# Patient Record
Sex: Female | Born: 1945 | Race: White | Hispanic: No | Smoking: Former smoker
Health system: Southern US, Community
[De-identification: ages and names within clinical notes are randomized; demographics above are authoritative.]

## PROBLEM LIST (undated history)

## (undated) DIAGNOSIS — M1990 Unspecified osteoarthritis, unspecified site: Secondary | ICD-10-CM

## (undated) DIAGNOSIS — F419 Anxiety disorder, unspecified: Secondary | ICD-10-CM

## (undated) DIAGNOSIS — F32A Depression, unspecified: Secondary | ICD-10-CM

## (undated) DIAGNOSIS — H269 Unspecified cataract: Secondary | ICD-10-CM

## (undated) DIAGNOSIS — G4700 Insomnia, unspecified: Secondary | ICD-10-CM

## (undated) DIAGNOSIS — T7840XA Allergy, unspecified, initial encounter: Secondary | ICD-10-CM

## (undated) DIAGNOSIS — F329 Major depressive disorder, single episode, unspecified: Secondary | ICD-10-CM

## (undated) DIAGNOSIS — K219 Gastro-esophageal reflux disease without esophagitis: Secondary | ICD-10-CM

## (undated) DIAGNOSIS — E079 Disorder of thyroid, unspecified: Secondary | ICD-10-CM

## (undated) HISTORY — PX: VEIN SURGERY: SHX48

## (undated) HISTORY — DX: Gastro-esophageal reflux disease without esophagitis: K21.9

## (undated) HISTORY — DX: Allergy, unspecified, initial encounter: T78.40XA

## (undated) HISTORY — DX: Unspecified cataract: H26.9

## (undated) HISTORY — PX: COLONOSCOPY: SHX174

## (undated) HISTORY — DX: Anxiety disorder, unspecified: F41.9

## (undated) HISTORY — DX: Major depressive disorder, single episode, unspecified: F32.9

## (undated) HISTORY — PX: POLYPECTOMY: SHX149

## (undated) HISTORY — DX: Unspecified osteoarthritis, unspecified site: M19.90

## (undated) HISTORY — PX: ABDOMINAL HYSTERECTOMY: SHX81

## (undated) HISTORY — DX: Insomnia, unspecified: G47.00

## (undated) HISTORY — PX: BREAST ENHANCEMENT SURGERY: SHX7

## (undated) HISTORY — DX: Depression, unspecified: F32.A

## (undated) HISTORY — DX: Disorder of thyroid, unspecified: E07.9

---

## 2012-12-23 ENCOUNTER — Encounter: Payer: Self-pay | Admitting: Internal Medicine

## 2013-01-02 ENCOUNTER — Encounter: Payer: Self-pay | Admitting: Internal Medicine

## 2013-01-07 ENCOUNTER — Encounter: Payer: Self-pay | Admitting: Internal Medicine

## 2013-01-07 ENCOUNTER — Ambulatory Visit (INDEPENDENT_AMBULATORY_CARE_PROVIDER_SITE_OTHER): Payer: Medicare Other | Admitting: Internal Medicine

## 2013-01-07 ENCOUNTER — Ambulatory Visit: Payer: Self-pay | Admitting: Internal Medicine

## 2013-01-07 ENCOUNTER — Other Ambulatory Visit (INDEPENDENT_AMBULATORY_CARE_PROVIDER_SITE_OTHER): Payer: Medicare Other

## 2013-01-07 VITALS — BP 142/72 | HR 76 | Ht 68.0 in | Wt 183.5 lb

## 2013-01-07 DIAGNOSIS — R110 Nausea: Secondary | ICD-10-CM

## 2013-01-07 DIAGNOSIS — R1013 Epigastric pain: Secondary | ICD-10-CM

## 2013-01-07 DIAGNOSIS — K219 Gastro-esophageal reflux disease without esophagitis: Secondary | ICD-10-CM | POA: Insufficient documentation

## 2013-01-07 DIAGNOSIS — Z1211 Encounter for screening for malignant neoplasm of colon: Secondary | ICD-10-CM

## 2013-01-07 DIAGNOSIS — R1310 Dysphagia, unspecified: Secondary | ICD-10-CM

## 2013-01-07 LAB — TSH: TSH: 1.39 u[IU]/mL (ref 0.35–5.50)

## 2013-01-07 LAB — CBC WITH DIFFERENTIAL/PLATELET
Basophils Absolute: 0 10*3/uL (ref 0.0–0.1)
HCT: 37.4 % (ref 36.0–46.0)
Lymphs Abs: 2.6 10*3/uL (ref 0.7–4.0)
MCHC: 34.2 g/dL (ref 30.0–36.0)
MCV: 89.9 fl (ref 78.0–100.0)
Monocytes Absolute: 1 10*3/uL (ref 0.1–1.0)
Platelets: 205 10*3/uL (ref 150.0–400.0)
RDW: 13 % (ref 11.5–14.6)

## 2013-01-07 LAB — COMPREHENSIVE METABOLIC PANEL
AST: 16 U/L (ref 0–37)
Alkaline Phosphatase: 49 U/L (ref 39–117)
BUN: 13 mg/dL (ref 6–23)
Calcium: 10.8 mg/dL — ABNORMAL HIGH (ref 8.4–10.5)
Creatinine, Ser: 0.7 mg/dL (ref 0.4–1.2)
Glucose, Bld: 103 mg/dL — ABNORMAL HIGH (ref 70–99)

## 2013-01-07 LAB — IGA: IgA: 248 mg/dL (ref 68–378)

## 2013-01-07 MED ORDER — ALIGN PO CAPS: 1.0000 | ORAL_CAPSULE | Freq: Every day | ORAL | Status: DC

## 2013-01-07 MED ORDER — MOVIPREP 100 G PO SOLR: 1.0000 | Freq: Once | ORAL | Status: DC

## 2013-01-07 NOTE — Patient Instructions (Signed)
Please go to the basement lab today for blood work You have been scheduled for a CT scan 01/10/13 1:00.  Please see additional instruction sheet for details and instructions You have been scheduled for a endoscopy and colonoscopy please see additional instruction sheets for details and instructions Please start on Align - 1 capsule daily Please pick up your prescription of Movi Prep at your pharmacy

## 2013-01-07 NOTE — Progress Notes (Signed)
Patient ID: Joanne Weber, female   DOB: 1946-06-02, 67 y.o.   MRN: [id number]  SUBJECTIVE: HPI Joanne Weber is a 67 yo female with PMH of arthritis and depression who is seen for evaluation of upper abdominal pain and nausea along with esophageal dysphagia. The patient reports over the last several weeks to months she's developed aching upper abdominal pain located in the epigastrium and at times left upper quadrant. She also endorses nausea without vomiting. The nausea is specifically postprandial. She continues to report a good appetite and no early satiety. She does have trouble with esophageal dysphagia, particularly for solid foods. Occasionally a large bolus of liquid will also reproduce her dysphagia symptoms. She reports frequent heartburn and using over-the-counter Gaviscon on a daily basis. Occasionally she uses this multiple times daily. She reports her bowel movements have changed over the last several years, previously being once per week over her lifetime, but now she is having smaller stools several times a day. She notes bowel movements usually with every urination, though they are small. She endorses frequent upper and lower GI gas. She denies melena or rectal bleeding. She does report a family history of GI cancer including pancreas cancer in her mother, a tumor at the ampulla in her father, and liver cancer in her brother, though there is some mention that this may be bone cancer primarily metastasizing to liver. She does not think her brother has cirrhosis. No fevers or chills. No weight loss.  She did have a colonoscopy performed 12 years ago for screening which was reportedly normal. No prior upper endoscopy  Review of Systems  As per history of present illness, otherwise negative   Past Medical History  Diagnosis Date  . Arthritis   . Depression   . Insomnia     Current Outpatient Prescriptions  Medication Sig Dispense Refill  . estrogens, conjugated, (PREMARIN) 0.625 MG tablet  Take 0.625 mg by mouth daily. Take daily for 21 days then do not take for 7 days.      . hydrochlorothiazide (HYDRODIURIL) 25 MG tablet Take 25 mg by mouth daily.      Marland Kitchen loratadine (ALLERGY RELIEF) 10 MG tablet Take 10 mg by mouth as needed for allergies.      . bifidobacterium infantis (ALIGN) capsule Take 1 capsule by mouth daily.  14 capsule  0  . MOVIPREP 100 G SOLR Take 1 kit (100 g total) by mouth once.  1 kit  0   No current facility-administered medications for this visit.    Allergies  Allergen Reactions  . Latex Itching, Swelling and Rash  . Penicillins Itching, Swelling and Rash    Family History  Problem Relation Age of Onset  . Liver cancer Brother     and bone cancer  . Pancreatic cancer Mother   . Prostate cancer Father   . Colon cancer Maternal Aunt   . Diabetes Father   . Diabetes Brother   . Heart disease Father   . Other Father     ampulla fo vater tumor    History  Substance Use Topics  . Smoking status: Former Smoker -- 2 years    Types: Cigarettes    Quit date: 11/07/1967  . Smokeless tobacco: Never Used  . Alcohol Use: Yes     Comment: occas    OBJECTIVE: BP 142/72  Pulse 76  Ht 5\' 8"  (1.727 m)  Wt 183 lb 8 oz (83.235 kg)  BMI 27.91 kg/m2 Constitutional: Well-developed and well-nourished. No distress.  HEENT: Normocephalic and atraumatic. Oropharynx is clear and moist. No oropharyngeal exudate. Conjunctivae are normal. No scleral icterus. Neck: Neck supple. Trachea midline. Cardiovascular: Normal rate, regular rhythm and intact distal pulses. No M/R/G Pulmonary/chest: Effort normal and breath sounds normal. No wheezing, rales or rhonchi. Abdominal: Soft, mild epigastric tenderness without rebound or guarding, nondistended. Bowel sounds active throughout. There are no masses palpable. No hepatosplenomegaly. Extremities: no clubbing, cyanosis, or edema Lymphadenopathy: No cervical adenopathy noted. Neurological: Alert and oriented to person  place and time. Skin: Skin is warm and dry. No rashes noted. Psychiatric: Normal mood and affect. Behavior is normal.  Labs  CMP, CBC, celiac panel and TSH today  ASSESSMENT AND PLAN: 67 yo female with PMH of arthritis and depression who is seen for evaluation of upper abdominal pain and nausea along with esophageal dysphagia.  1.  Epigastric pain/nausea/esophageal dysphagia -- her symptoms seem dyspeptic in nature and she certainly has symptoms consistent with GERD. She is using Gaviscon on a daily basis and I have offered her PPI therapy, but she wishes to pursue endoscopy first before starting any new medications. Her thought is that she does not want to be on a medication she might not need. We discussed upper endoscopy, which is my recommendation today to better evaluate her epigastric pain and dysphagia. We discussed the test and she is agreeable to proceed. Also will perform a CT scan of the abdomen and pelvis to further evaluate her symptoms and also rule out any pancreatic lesion/tumor which is greatly concerned about given her family history and current symptoms.  2.  Colorectal cancer screening -- she is due for screening colonoscopy and we will schedule this on the same day as her upper endoscopy. The risks and benefits were discussed and she is agreeable to proceed. I would like her date try Align one capsule daily as a probiotic to help with her gas and bloating and to hopefully provide more bowel regularity. We discussed that her small stools associated with urination may be secondary to pelvic floor dyssynergia but we will proceed with colonoscopy first before any further workup/evaluation.

## 2013-01-10 ENCOUNTER — Other Ambulatory Visit: Payer: Medicare Other

## 2013-01-24 ENCOUNTER — Ambulatory Visit (INDEPENDENT_AMBULATORY_CARE_PROVIDER_SITE_OTHER)
Admission: RE | Admit: 2013-01-24 | Discharge: 2013-01-24 | Disposition: A | Discharge status: Home / Self Care | Payer: Medicare Other | Source: Ambulatory Visit | Attending: Internal Medicine | Admitting: Internal Medicine

## 2013-01-24 DIAGNOSIS — R110 Nausea: Secondary | ICD-10-CM

## 2013-01-24 DIAGNOSIS — K219 Gastro-esophageal reflux disease without esophagitis: Secondary | ICD-10-CM

## 2013-01-24 DIAGNOSIS — R1013 Epigastric pain: Secondary | ICD-10-CM

## 2013-01-24 MED ORDER — IOHEXOL 300 MG/ML  SOLN
100.0000 mL | Freq: Once | INTRAMUSCULAR | Status: AC | PRN
  Administered 2013-01-24: 100 mL via INTRAVENOUS

## 2013-01-28 ENCOUNTER — Telehealth: Payer: Self-pay | Admitting: *Deleted

## 2013-01-28 NOTE — Telephone Encounter (Signed)
Message copied by Florene Glen on Tue Jan 28, 2013  1:13 PM ------      Message from: Beverley Fiedler      Created: Tue Jan 28, 2013 12:34 PM       Please let patient know her CT scan of the abdomen shows the following:       Overall her bowel, gallbladder, pancreas, and spleen looked normal      She has 2 small lesions in her liver one is a hemangioma, which is a benign collection of blood vessels. The other looks like a benign cyst -- I do not expect this is causing any symptoms.      She also has 2 cysts in her kidneys which look benign      She has a lesion in her left adrenal gland, which is likely an adenoma, which are usually benign.  These can produce hormones, and while this is unlikely, it should be evaluated further either by her primary care provider or by an endocrinologist. Please offer an endocrinology referral or that she contract her PCP. If she elects to do so with her PCP, please fax a copy of this report to her PCP      We will proceed with endoscopy and colonoscopy in April as scheduled      All in all this is a good report, but often CT scan shows other "incidental" findings as described above.       ------

## 2013-01-28 NOTE — Telephone Encounter (Signed)
Informed pt of her CT results and the need for an endocrinology appt. I gave her several names and she will ask a nurse friend for advice and call me when she arrives home; pt is in [place] for her brother's funeral. She asked that I fax the CT results to her PCP, Dr Caryl Asp in [place]; done. Fax [phone]

## 2013-01-31 ENCOUNTER — Telehealth: Payer: Self-pay | Admitting: *Deleted

## 2013-01-31 NOTE — Telephone Encounter (Signed)
I had called pt on 01/28/13 and she was to call me back with the names of an Endocrinologist. She called to day and asked to be referred to Dr Particia Nearing in [place]; she has an appt on 02/07/13. Called Dr Beckie Busing ofc and received the fax number., [phone] and faxed info. Pt requested copies, so I mailed them to her. ofc # [id number].

## 2013-02-24 ENCOUNTER — Encounter: Payer: Self-pay | Admitting: *Deleted

## 2013-02-25 ENCOUNTER — Encounter: Payer: Self-pay | Admitting: Internal Medicine

## 2013-02-25 ENCOUNTER — Ambulatory Visit (AMBULATORY_SURGERY_CENTER): Payer: Medicare Other | Admitting: Internal Medicine

## 2013-02-25 VITALS — BP 153/76 | HR 65 | Temp 96.9°F | Resp 23 | Ht 68.0 in | Wt 183.0 lb

## 2013-02-25 DIAGNOSIS — K635 Polyp of colon: Secondary | ICD-10-CM

## 2013-02-25 DIAGNOSIS — D126 Benign neoplasm of colon, unspecified: Secondary | ICD-10-CM

## 2013-02-25 DIAGNOSIS — R1013 Epigastric pain: Secondary | ICD-10-CM

## 2013-02-25 DIAGNOSIS — R1319 Other dysphagia: Secondary | ICD-10-CM

## 2013-02-25 DIAGNOSIS — R1310 Dysphagia, unspecified: Secondary | ICD-10-CM

## 2013-02-25 DIAGNOSIS — K219 Gastro-esophageal reflux disease without esophagitis: Secondary | ICD-10-CM

## 2013-02-25 DIAGNOSIS — Z1211 Encounter for screening for malignant neoplasm of colon: Secondary | ICD-10-CM

## 2013-02-25 DIAGNOSIS — K3189 Other diseases of stomach and duodenum: Secondary | ICD-10-CM

## 2013-02-25 DIAGNOSIS — R1314 Dysphagia, pharyngoesophageal phase: Secondary | ICD-10-CM

## 2013-02-25 MED ORDER — PANTOPRAZOLE SODIUM 40 MG PO TBEC: DELAYED_RELEASE_TABLET | ORAL | Status: DC

## 2013-02-25 MED ORDER — SODIUM CHLORIDE 0.9 % IV SOLN: 500.0000 mL | INTRAVENOUS | Status: DC

## 2013-02-25 NOTE — Progress Notes (Signed)
Patient did not experience any of the following events: a burn prior to discharge; a fall within the facility; wrong site/side/patient/procedure/implant event; or a hospital transfer or hospital admission upon discharge from the facility. (G8907) Patient did not have preoperative order for IV antibiotic SSI prophylaxis. (G8918)  

## 2013-02-25 NOTE — Op Note (Signed)
[facility] [address] [place], [address]   ENDOSCOPY PROCEDURE REPORT  PATIENT: Joanne, Weber  MR#: [id number] BIRTHDATE: January 03, 1946 , 67  yrs. old GENDER: Female ENDOSCOPIST: Beverley Fiedler, MD PROCEDURE DATE:  02/25/2013 PROCEDURE:  EGD w/ biopsy and Savary dilation of esophagus ASA CLASS:     Class II INDICATIONS:  Dysphagia.   Dyspepsia. MEDICATIONS: MAC sedation, administered by CRNA and propofol (Diprivan) 250mg  IV TOPICAL ANESTHETIC: Cetacaine Spray  DESCRIPTION OF PROCEDURE: After the risks benefits and alternatives of the procedure were thoroughly explained, informed consent was obtained.  The LB GIF-H180 [id number] endoscope was introduced through the mouth and advanced to the second portion of the duodenum. Without limitations.  The instrument was slowly withdrawn as the mucosa was fully examined.   ESOPHAGUS: The mucosa of the esophagus appeared normal.  Multiple biopsies were taken in the distal and mid esophagus to rule out eosinophilic esophagitis.   A normal Z-line was observed 39 cm from the incisors.  Given symptoms of dysphagia, empiric dilation was performed over a guidewire with 17 mm Savory (1 pass minimal resistance)  STOMACH: Mild gastropathy characterized by erythema was found in the gastric antrum.  Biopsies were taken in the antrum and angularis.   DUODENUM: The duodenal mucosa showed no abnormalities in the bulb and second portion of the duodenum. Retroflexed views revealed no abnormalities.     The scope was then withdrawn from the patient and the procedure completed.  COMPLICATIONS: There were no complications.  ENDOSCOPIC IMPRESSION: 1.   The mucosa of the esophagus appeared normal; multiple biopsies were taken in the distal and mid esophagus to rule out eosinophilic esophagitis.  Empiric dilation with 17 mm Savory dilator 2.   Normal Z-line was observed 39 cm from the incisors 3.   Gastropathy was found in the gastric  antrum; biopsies were taken in the antrum and angularis 4.   The duodenal mucosa showed no abnormalities in the bulb and second portion of the duodenum  RECOMMENDATIONS: 1.  Await pathology results 2.  Trial of pantoprazole 40 mg daily (best taken 30 minutes to 1 hour before your 1st meal of the day) 3.  Office follow-up in about 1 month  eSigned:  Beverley Fiedler, MD 02/25/2013 10:02 AM     CC:The Patient

## 2013-02-25 NOTE — Progress Notes (Signed)
Lidocaine-40mg IV prior to Propofol InductionPropofol given over incremental dosages 

## 2013-02-25 NOTE — Patient Instructions (Addendum)
YOU HAD AN ENDOSCOPIC PROCEDURE TODAY AT THE [facility]: Refer to the procedure report that was given to you for any specific questions about what was found during the examination.  If the procedure report does not answer your questions, please call your gastroenterologist to clarify.  If you requested that your care partner not be given the details of your procedure findings, then the procedure report has been included in a sealed envelope for you to review at your convenience later.  YOU SHOULD EXPECT: Some feelings of bloating in the abdomen. Passage of more gas than usual.  Walking can help get rid of the air that was put into your GI tract during the procedure and reduce the bloating. If you had a lower endoscopy (such as a colonoscopy or flexible sigmoidoscopy) you may notice spotting of blood in your stool or on the toilet paper. If you underwent a bowel prep for your procedure, then you may not have a normal bowel movement for a few days.  DIET: FOLLOW DILATION DIET- SEE HANDOUT  Drink plenty of fluids but you should avoid alcoholic beverages for 24 hours.  ACTIVITY: Your care partner should take you home directly after the procedure.  You should plan to take it easy, moving slowly for the rest of the day.  You can resume normal activity the day after the procedure however you should NOT DRIVE or use heavy machinery for 24 hours (because of the sedation medicines used during the test).    SYMPTOMS TO REPORT IMMEDIATELY: A gastroenterologist can be reached at any hour.  During normal business hours, 8:30 AM to 5:00 PM Monday through Friday, call [phone].  After hours and on weekends, please call the GI answering service at [phone] who will take a message and have the physician on call contact you.   Following lower endoscopy (colonoscopy or flexible sigmoidoscopy):  Excessive amounts of blood in the stool  Significant tenderness or worsening of abdominal  pains  Swelling of the abdomen that is new, acute  Fever of 100F or higher  Following upper endoscopy (EGD)  Vomiting of blood or coffee ground material  New chest pain or pain under the shoulder blades  Painful or persistently difficult swallowing  New shortness of breath  Fever of 100F or higher  Black, tarry-looking stools  FOLLOW UP: If any biopsies were taken you will be contacted by phone or by letter within the next 1-3 weeks.  Call your gastroenterologist if you have not heard about the biopsies in 3 weeks.  Our staff will call the home number listed on your records the next business day following your procedure to check on you and address any questions or concerns that you may have at that time regarding the information given to you following your procedure. This is a courtesy call and so if there is no answer at the home number and we have not heard from you through the emergency physician on call, we will assume that you have returned to your regular daily activities without incident.  SIGNATURES/CONFIDENTIALITY: You and/or your care partner have signed paperwork which will be entered into your electronic medical record.  These signatures attest to the fact that that the information above on your After Visit Summary has been reviewed and is understood.  Full responsibility of the confidentiality of this discharge information lies with you and/or your care-partner.  Please call office tomorrow to set up appointment for 1 month- follow up  Await pathology  Take Pantoprazole daily 30 minutes to 1 hout before your first meal of the day  Please read over information about diverticulosis, polyps, high fiber diets, and esophagitis  Your prescription has been sent to [facility] [place], [place]

## 2013-02-25 NOTE — Op Note (Signed)
[facility] [address] [place], [address]   COLONOSCOPY PROCEDURE REPORT  PATIENT: Joanne Weber, Joanne Weber  MR#: [id number] BIRTHDATE: 07-Aug-1946 , 67  yrs. old GENDER: Female ENDOSCOPIST: Beverley Fiedler, MD PROCEDURE DATE:  02/25/2013 PROCEDURE:   Colonoscopy with snare polypectomy ASA CLASS:   Class II INDICATIONS:average risk screening and Last colonoscopy performed > 10 years ago. MEDICATIONS: MAC sedation, administered by CRNA and propofol (Diprivan) 150mg  IV  DESCRIPTION OF PROCEDURE:   After the risks benefits and alternatives of the procedure were thoroughly explained, informed consent was obtained.  A digital rectal exam revealed no rectal mass.   The LB CF-H180AL [id number]  endoscope was introduced through the anus and advanced to the cecum, which was identified by both the appendix and ileocecal valve. No adverse events experienced. The quality of the prep was good, using MoviPrep  The instrument was then slowly withdrawn as the colon was fully examined.   COLON FINDINGS: Three sessile polyps measuring 3-5 mm in size were found in the sigmoid colon.  Polypectomy was performed using cold snare.  All resections were complete and all polyp tissue was completely retrieved.   Mild diverticulosis was noted at the cecum, in the ascending colon, and sigmoid colon.   The colon mucosa was otherwise normal.  Retroflexed views revealed no abnormalities. The time to cecum=4 minutes 08 seconds.  Withdrawal time=12 minutes 45 seconds.  The scope was withdrawn and the procedure completed. COMPLICATIONS: There were no complications.  ENDOSCOPIC IMPRESSION: 1.   Three sessile polyps measuring 3-5 mm in size were found in the sigmoid colon; Polypectomy was performed using cold snare 2.   Mild diverticulosis was noted at the cecum, in the ascending colon, and sigmoid colon 3.   The colon mucosa was otherwise normal  RECOMMENDATIONS: 1.  Await pathology results 2.  High  fiber diet 3.  If the polyps removed today are proven to be adenomatous (pre-cancerous) polyps, you will need a colonoscopy in 3 years. Otherwise you should continue to follow colorectal cancer screening guidelines for "routine risk" patients with a colonoscopy in 10 years.  You will receive a letter within 1-2 weeks with the results of your biopsy as well as final recommendations.  Please call my office if you have not received a letter after 3 weeks.   eSigned:  Beverley Fiedler, MD 02/25/2013 10:05 AM cc: The Patient

## 2013-02-26 ENCOUNTER — Telehealth: Payer: Self-pay | Admitting: *Deleted

## 2013-02-26 NOTE — Telephone Encounter (Signed)
  Follow up Call-  Call back number 02/25/2013  Post procedure Call Back phone  # [phone]  Permission to leave phone message Yes     Patient questions:  Do you have a fever, pain , or abdominal swelling? no Pain Score  0 *  Have you tolerated food without any problems? yes  Have you been able to return to your normal activities? yes  Do you have any questions about your discharge instructions: Diet   no Medications  no Follow up visit  no  Do you have questions or concerns about your Care? no  Actions: * If pain score is 4 or above: No action needed, pain <4.  Patient c/o sore throat but eating and drinking without any difficulty. Encouraged patient to call us back as needed.

## 2013-02-28 ENCOUNTER — Telehealth: Payer: Self-pay | Admitting: *Deleted

## 2013-02-28 NOTE — Telephone Encounter (Signed)
Mailed pt a letter for f/u appt on 03/25/13 with Dr Rhea Belton.

## 2013-03-03 ENCOUNTER — Encounter: Payer: Self-pay | Admitting: Internal Medicine

## 2013-03-07 ENCOUNTER — Telehealth: Payer: Self-pay | Admitting: Internal Medicine

## 2013-03-07 DIAGNOSIS — K219 Gastro-esophageal reflux disease without esophagitis: Secondary | ICD-10-CM

## 2013-03-07 NOTE — Telephone Encounter (Signed)
Informed pt I scheduled her with the wrong doctor and she states she's doing great on pantoprazole. We agreed she will f/u on a PRN basis. She has not received her Recall letter, but we discussed her path and the letter should be arriving soon; pt stated understanding.

## 2013-03-07 NOTE — Telephone Encounter (Signed)
lmom for pt to call back. I scheduled pt with the wrong MD; should be Dr Rhea Belton.

## 2013-03-25 ENCOUNTER — Ambulatory Visit: Payer: Medicare Other | Admitting: Gastroenterology

## 2013-05-23 ENCOUNTER — Other Ambulatory Visit: Payer: Self-pay | Admitting: Internal Medicine

## 2013-06-11 ENCOUNTER — Other Ambulatory Visit: Payer: Self-pay

## 2013-08-25 ENCOUNTER — Other Ambulatory Visit (HOSPITAL_COMMUNITY): Payer: Self-pay | Admitting: "Endocrinology

## 2013-08-25 DIAGNOSIS — D3502 Benign neoplasm of left adrenal gland: Secondary | ICD-10-CM

## 2013-08-25 DIAGNOSIS — E279 Disorder of adrenal gland, unspecified: Secondary | ICD-10-CM

## 2013-08-25 DIAGNOSIS — E278 Other specified disorders of adrenal gland: Secondary | ICD-10-CM

## 2013-08-28 ENCOUNTER — Ambulatory Visit (HOSPITAL_COMMUNITY): Payer: Medicare Other

## 2013-09-02 ENCOUNTER — Other Ambulatory Visit (HOSPITAL_COMMUNITY): Payer: Medicare Other

## 2013-09-02 ENCOUNTER — Other Ambulatory Visit: Payer: Self-pay | Admitting: Internal Medicine

## 2013-09-02 ENCOUNTER — Ambulatory Visit (HOSPITAL_COMMUNITY): Payer: Medicare Other

## 2013-09-02 ENCOUNTER — Ambulatory Visit (HOSPITAL_COMMUNITY)
Admission: RE | Admit: 2013-09-02 | Discharge: 2013-09-02 | Disposition: A | Discharge status: Home / Self Care | Payer: Medicare Other | Source: Ambulatory Visit | Attending: "Endocrinology | Admitting: "Endocrinology

## 2013-09-02 DIAGNOSIS — D3500 Benign neoplasm of unspecified adrenal gland: Secondary | ICD-10-CM | POA: Insufficient documentation

## 2013-09-02 DIAGNOSIS — D1803 Hemangioma of intra-abdominal structures: Secondary | ICD-10-CM | POA: Insufficient documentation

## 2013-09-02 DIAGNOSIS — Q762 Congenital spondylolisthesis: Secondary | ICD-10-CM | POA: Insufficient documentation

## 2013-09-02 DIAGNOSIS — D3502 Benign neoplasm of left adrenal gland: Secondary | ICD-10-CM

## 2013-09-11 ENCOUNTER — Other Ambulatory Visit: Payer: Self-pay

## 2013-12-02 ENCOUNTER — Other Ambulatory Visit: Payer: Self-pay | Admitting: Internal Medicine

## 2014-03-02 ENCOUNTER — Other Ambulatory Visit: Payer: Self-pay | Admitting: Internal Medicine

## 2014-06-12 ENCOUNTER — Other Ambulatory Visit: Payer: Self-pay | Admitting: Internal Medicine

## 2014-09-16 IMAGING — CT CT ABDOMEN W/O CM
2 of 4 series · 15 of 46 positions shown, 17 images · non-contrast
Comparison: 01/24/2013

CLINICAL DATA: Evaluate adrenal adenoma.

EXAM:
CT ABDOMEN WITHOUT CONTRAST
TECHNIQUE: Multidetector CT imaging of the abdomen was performed following the
standard protocol without IV contrast.

[Series 2: abd 5.0 i30f 1 · axial · 0.71mm/px · z∈[+1066,+1316]mm · 12 of 56 slices shown, 14 images]
[im 3/56  soft-tissue]
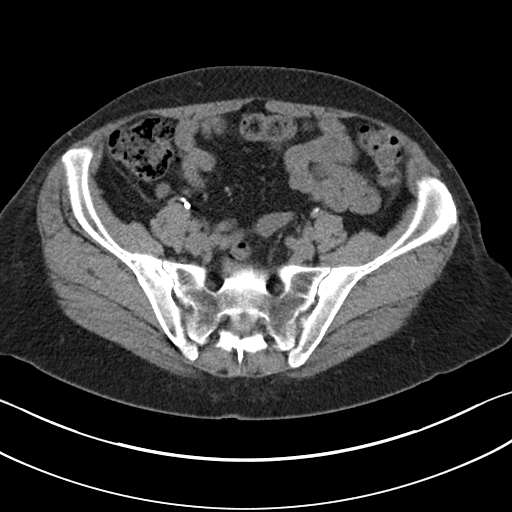
[im 3/56  bone]
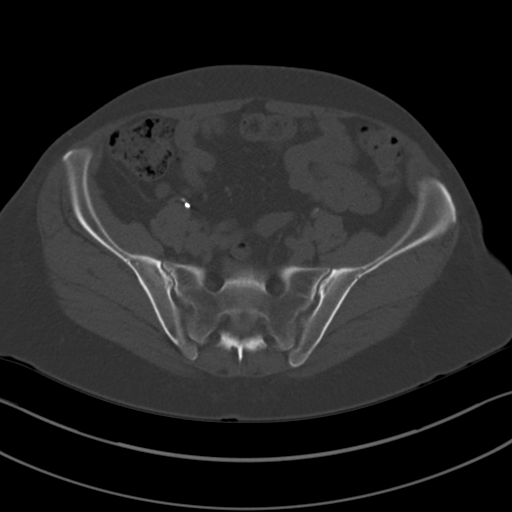
[im 8/56  soft-tissue]
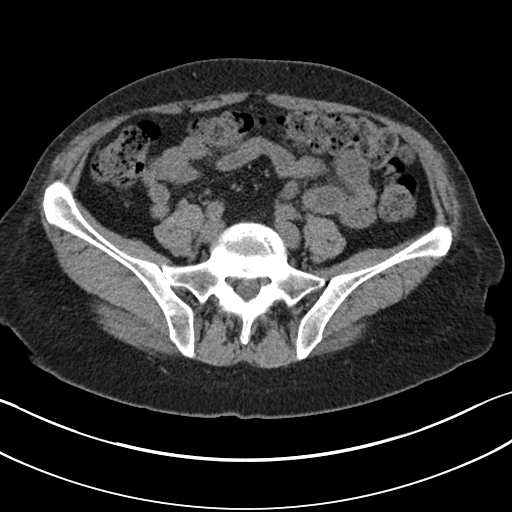
[im 13/56  soft-tissue]
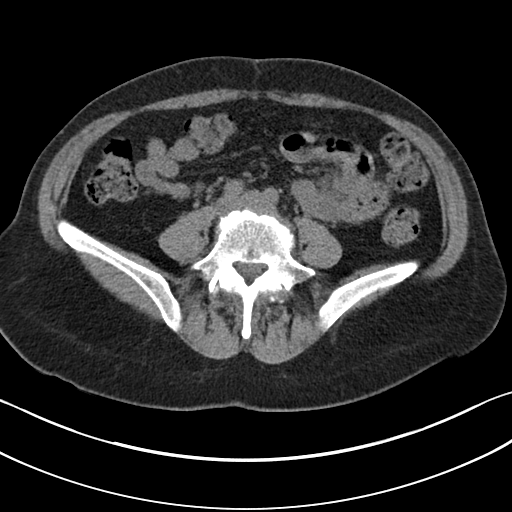
[im 18/56  soft-tissue]
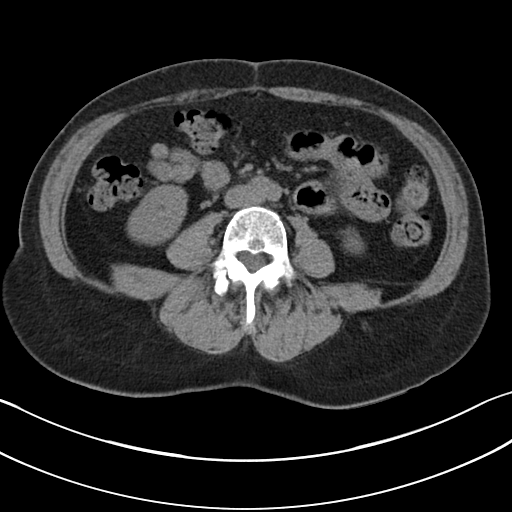
[im 21/56  soft-tissue]
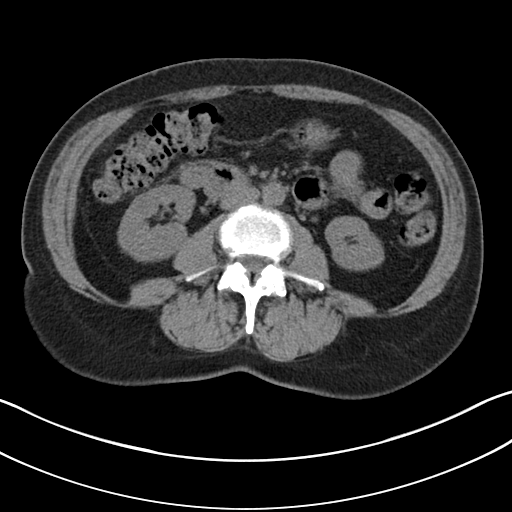
[im 26/56  soft-tissue]
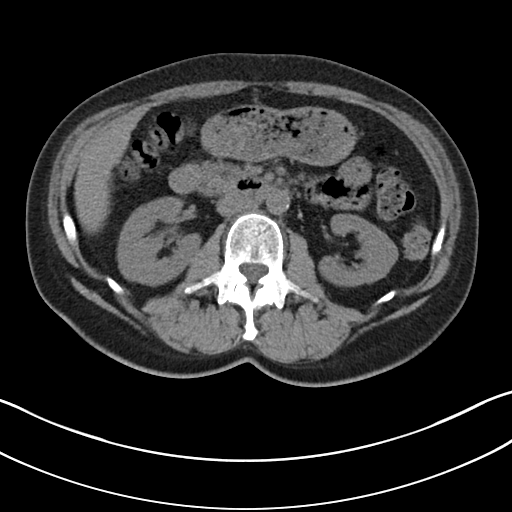
[im 31/56  soft-tissue]
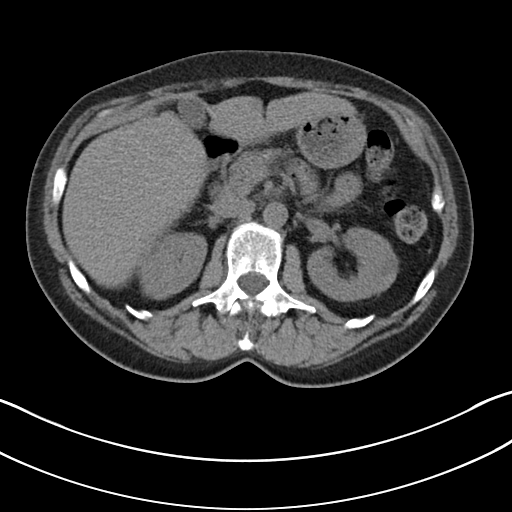
[im 36/56  soft-tissue]
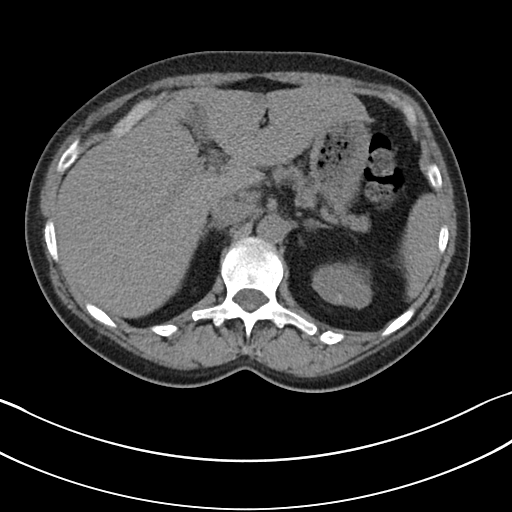
[im 38/56  soft-tissue]
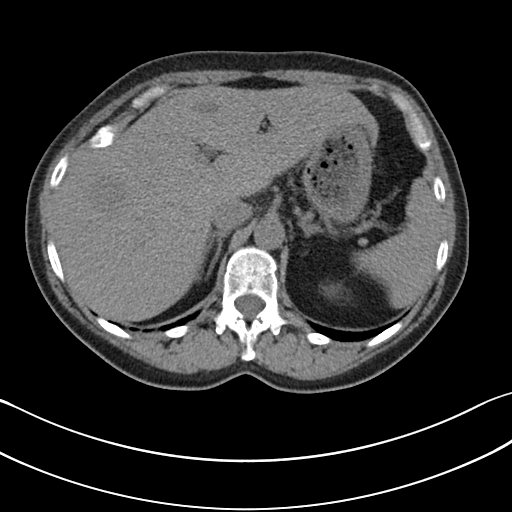
[im 38/56  bone]
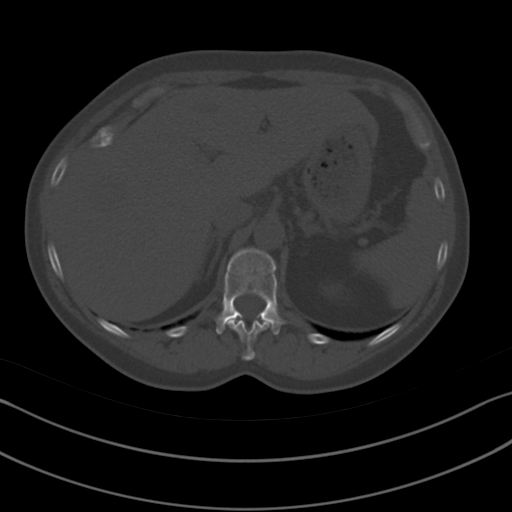
[im 43/56  soft-tissue]
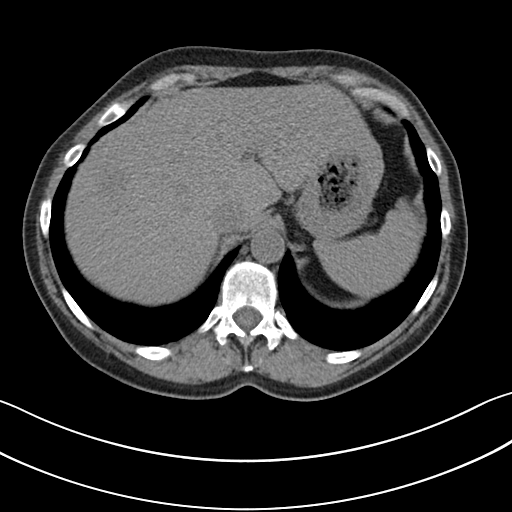
[im 48/56  soft-tissue]
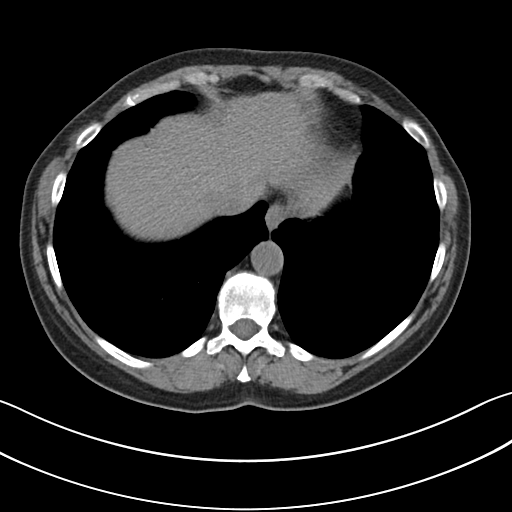
[im 53/56  soft-tissue]
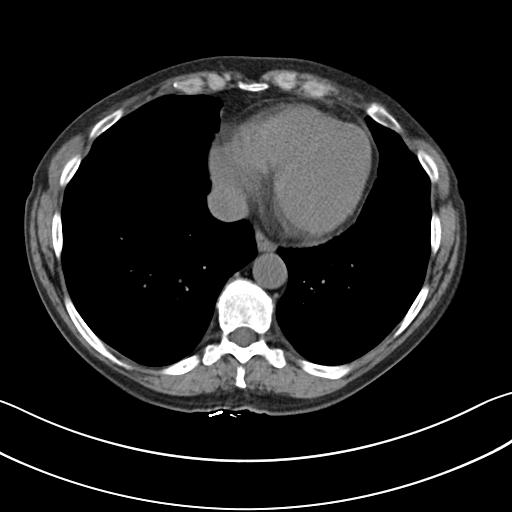

[Series 5: cor st · coronal · 0.54mm/px · 3 of 84 slices shown]
[im 28/84  soft-tissue]
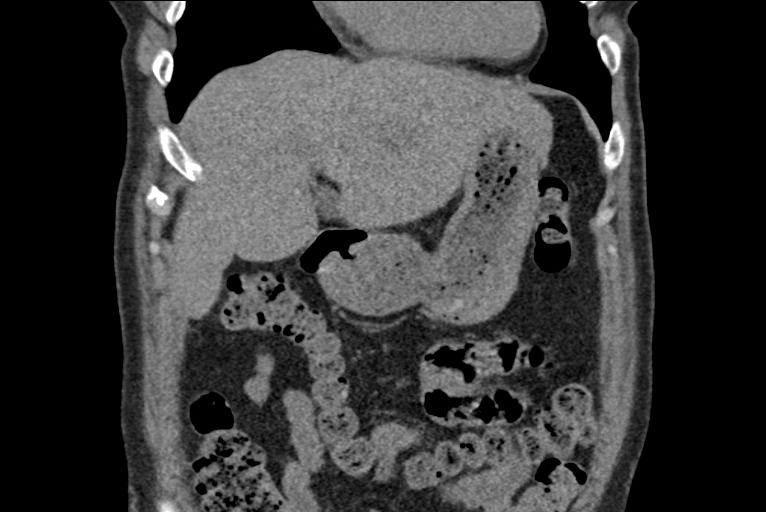
[im 37/84  soft-tissue]
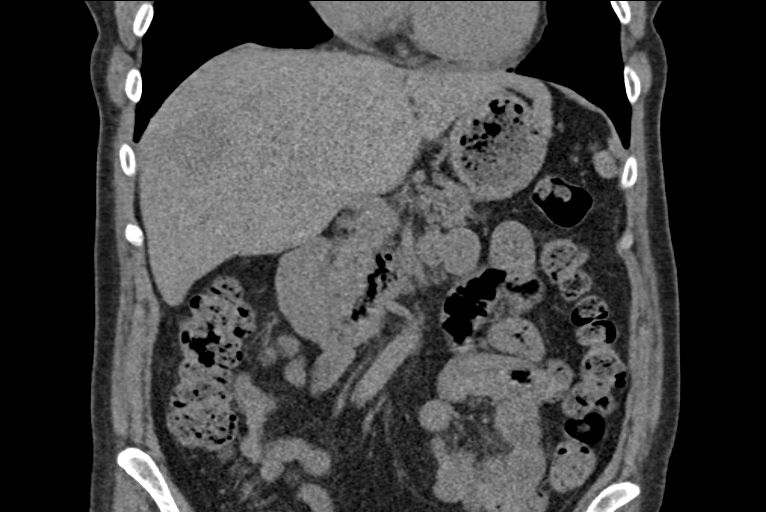
[im 47/84  soft-tissue]
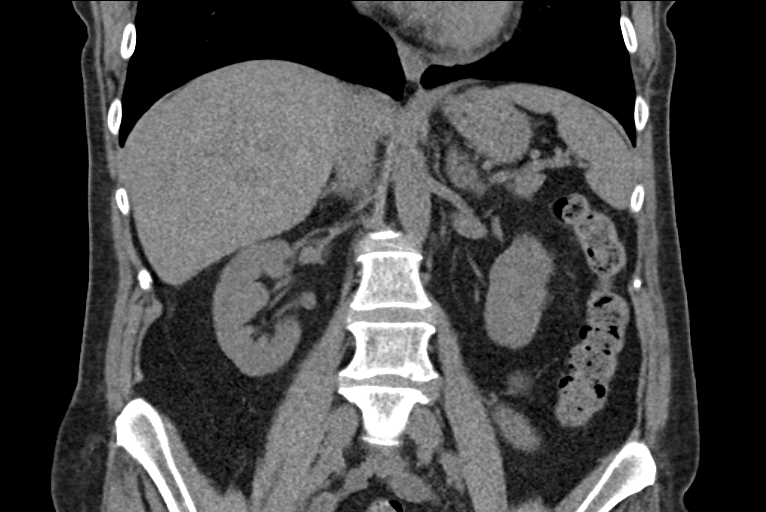

[15 of 46 positions shown; findings below may reference images not displayed]

FINDINGS: Lung bases are clear.  Negative for free air.

Again noted is a small nodule involving the left adrenal gland. This
nodule roughly measures 1.3 cm and previously measured 1.2 cm. The
central aspect of the nodule is low-density with Hounsfield units of
8. Findings are compatible with a benign adenoma. Normal appearance
of the right adrenal gland.

Again noted are low-density structures within the liver which are
consistent with a hemangioma and a cyst. Cystic structure in the
anterior left hepatic lobe is stable, roughly measuring 2.1 cm. The
previously described hemangioma is ill-defined on this noncontrast
study but not significantly changed in size. Again noted is a
low-density structure involving left kidney upper pole which is
suggestive for a cyst. There is a stable hyperdense structure along
the posterior left kidney mid pole which measures roughly 5 mm and
unchanged. This could represent a small hemorrhagic or proteinaceous
cyst. Normal appearance of the right kidney, spleen and pancreas. No
significant free fluid or lymphadenopathy. Degenerate facet disease
in the lower lumbar spine. Mild anterolisthesis at L4-5 is likely
secondary to facet arthropathy.
IMPRESSION: Stable left adrenal nodule. Findings are most compatible with a
benign left adrenal adenoma.

No significant change to the liver hemangioma. No significant change
in the cysts involving the left kidney and liver.

## 2014-09-21 ENCOUNTER — Other Ambulatory Visit: Payer: Self-pay | Admitting: Internal Medicine

## 2014-09-22 ENCOUNTER — Other Ambulatory Visit: Payer: Self-pay | Admitting: Internal Medicine

## 2014-12-14 ENCOUNTER — Other Ambulatory Visit: Payer: Self-pay | Admitting: Internal Medicine

## 2016-01-07 ENCOUNTER — Encounter: Payer: Self-pay | Admitting: Internal Medicine

## 2016-03-06 ENCOUNTER — Encounter: Payer: Self-pay | Admitting: Internal Medicine

## 2016-04-07 ENCOUNTER — Ambulatory Visit (AMBULATORY_SURGERY_CENTER): Payer: Self-pay | Admitting: *Deleted

## 2016-04-07 VITALS — Ht 68.0 in | Wt 173.0 lb

## 2016-04-07 DIAGNOSIS — Z86010 Personal history of colonic polyps: Secondary | ICD-10-CM

## 2016-04-07 MED ORDER — NA SULFATE-K SULFATE-MG SULF 17.5-3.13-1.6 GM/177ML PO SOLN: 1.0000 | Freq: Once | ORAL | Status: DC

## 2016-04-07 NOTE — Progress Notes (Signed)
No egg or soy allergy known to patient  No issues with past sedation with any surgeries  or procedures, no intubation problems  No diet pills per patient No home 02 use per patient  No blood thinners per patient  Pt denies issues with constipation  emmi video declined Pt states when she voids a lot of the time, she has a BM, its firm but not loose - going on x 3 years, never sees blood

## 2016-04-11 ENCOUNTER — Telehealth: Payer: Self-pay | Admitting: Internal Medicine

## 2016-04-11 NOTE — Telephone Encounter (Signed)
Spoke with pt.Informed pt about the Miralax  prep.Will mail  new instruction for the Miralax bowel prep.Marland KitchenShe will review the instructions and call back if she has any further questions

## 2016-05-02 ENCOUNTER — Encounter: Payer: Self-pay | Admitting: Internal Medicine

## 2016-05-02 ENCOUNTER — Ambulatory Visit (AMBULATORY_SURGERY_CENTER): Payer: Medicare Other | Admitting: Internal Medicine

## 2016-05-02 ENCOUNTER — Encounter: Payer: Medicare Other | Admitting: Internal Medicine

## 2016-05-02 VITALS — BP 128/61 | HR 59 | Temp 97.8°F | Resp 13 | Ht 68.0 in | Wt 173.0 lb

## 2016-05-02 DIAGNOSIS — D120 Benign neoplasm of cecum: Secondary | ICD-10-CM | POA: Diagnosis not present

## 2016-05-02 DIAGNOSIS — K635 Polyp of colon: Secondary | ICD-10-CM

## 2016-05-02 DIAGNOSIS — D122 Benign neoplasm of ascending colon: Secondary | ICD-10-CM

## 2016-05-02 DIAGNOSIS — Z86010 Personal history of colonic polyps: Secondary | ICD-10-CM | POA: Diagnosis present

## 2016-05-02 DIAGNOSIS — D123 Benign neoplasm of transverse colon: Secondary | ICD-10-CM

## 2016-05-02 MED ORDER — SODIUM CHLORIDE 0.9 % IV SOLN: 500.0000 mL | INTRAVENOUS | Status: DC

## 2016-05-02 NOTE — Progress Notes (Signed)
Report to PACU, RN, vss, BBS= Clear.  

## 2016-05-02 NOTE — Patient Instructions (Signed)
YOU HAD AN ENDOSCOPIC PROCEDURE TODAY AT [facility]:   Refer to the procedure report that was given to you for any specific questions about what was found during the examination.  If the procedure report does not answer your questions, please call your gastroenterologist to clarify.  If you requested that your care partner not be given the details of your procedure findings, then the procedure report has been included in a sealed envelope for you to review at your convenience later.  YOU SHOULD EXPECT: Some feelings of bloating in the abdomen. Passage of more gas than usual.  Walking can help get rid of the air that was put into your GI tract during the procedure and reduce the bloating. If you had a lower endoscopy (such as a colonoscopy or flexible sigmoidoscopy) you may notice spotting of blood in your stool or on the toilet paper. If you underwent a bowel prep for your procedure, you may not have a normal bowel movement for a few days.  Please Note:  You might notice some irritation and congestion in your nose or some drainage.  This is from the oxygen used during your procedure.  There is no need for concern and it should clear up in a day or so.  SYMPTOMS TO REPORT IMMEDIATELY:   Following lower endoscopy (colonoscopy or flexible sigmoidoscopy):  Excessive amounts of blood in the stool  Significant tenderness or worsening of abdominal pains  Swelling of the abdomen that is new, acute  Fever of 100F or higher  For urgent or emergent issues, a gastroenterologist can be reached at any hour by calling [phone].   DIET: Your first meal following the procedure should be a small meal and then it is ok to progress to your normal diet. Heavy or fried foods are harder to digest and may make you feel nauseous or bloated.  Likewise, meals heavy in dairy and vegetables can increase bloating.  Drink plenty of fluids but you should avoid alcoholic beverages for 24  hours.  ACTIVITY:  You should plan to take it easy for the rest of today and you should NOT DRIVE or use heavy machinery until tomorrow (because of the sedation medicines used during the test).    FOLLOW UP: Our staff will call the number listed on your records the next business day following your procedure to check on you and address any questions or concerns that you may have regarding the information given to you following your procedure. If we do not reach you, we will leave a message.  However, if you are feeling well and you are not experiencing any problems, there is no need to return our call.  We will assume that you have returned to your regular daily activities without incident.  If any biopsies were taken you will be contacted by phone or by letter within the next 1-3 weeks.  Please call us at [phone] if you have not heard about the biopsies in 3 weeks.    SIGNATURES/CONFIDENTIALITY: You and/or your care partner have signed paperwork which will be entered into your electronic medical record.  These signatures attest to the fact that that the information above on your After Visit Summary has been reviewed and is understood.  Full responsibility of the confidentiality of this discharge information lies with you and/or your care-partner.  No aspirin, ibuprofen, naproxen, aleve, or other non-steroidal anti-inflammatory drugs for 2 weeks after polyp removal. Please review polyp, diverticulosis, and high fiber diet handouts  provided. Next colonoscopy determined by pathology results.

## 2016-05-02 NOTE — Op Note (Signed)
[facility] Patient Name: Joanne Weber Procedure Date: 05/02/2016 11:39 AM MRN: [id number] Endoscopist: Jerene Bears , MD Age: 70 Referring MD:  Date of Birth: 1946-06-15 Gender: Female Account #: [id number] Procedure:                Colonoscopy Indications:              Surveillance: Personal history of adenomatous                            polyps on last colonoscopy 3 years ago Medicines:                Monitored Anesthesia Care Procedure:                Pre-Anesthesia Assessment:                           - Prior to the procedure, a History and Physical                            was performed, and patient medications and                            allergies were reviewed. The patient's tolerance of                            previous anesthesia was also reviewed. The risks                            and benefits of the procedure and the sedation                            options and risks were discussed with the patient.                            All questions were answered, and informed consent                            was obtained. Prior Anticoagulants: The patient has                            taken no previous anticoagulant or antiplatelet                            agents. ASA Grade Assessment: II - A patient with                            mild systemic disease. After reviewing the risks                            and benefits, the patient was deemed in                            satisfactory condition to undergo the procedure.  After obtaining informed consent, the colonoscope                            was passed under direct vision. Throughout the                            procedure, the patient's blood pressure, pulse, and                            oxygen saturations were monitored continuously. The                            Model PCF-[id number]) scope was introduced                            through the anus and  advanced to the the cecum,                            identified by appendiceal orifice and ileocecal                            valve. The colonoscopy was performed without                            difficulty. The patient tolerated the procedure                            well. The quality of the bowel preparation was                            good. The ileocecal valve, appendiceal orifice, and                            rectum were photographed. Scope In: 11:56:30 AM Scope Out: 12:15:46 PM Scope Withdrawal Time: 0 hours 15 minutes 56 seconds  Total Procedure Duration: 0 hours 19 minutes 16 seconds  Findings:                 The digital rectal exam was normal.                           Two sessile polyps were found in the ascending                            colon and cecum. The polyps were 2 to 3 mm in size.                            These polyps were removed with a cold snare.                            Resection and retrieval were complete.                           A 10 mm polyp was found in the  proximal transverse                            colon. The polyp was semi-pedunculated. The polyp                            was removed with a hot snare. Resection and                            retrieval were complete.                           Multiple small and large-mouthed diverticula were                            found in the hepatic flexure, ascending colon and                            cecum.                           The exam was otherwise without abnormality on                            direct and retroflexion views. Complications:            No immediate complications. Estimated Blood Loss:     Estimated blood loss: none. Impression:               - Two 2 to 3 mm polyps in the ascending colon and                            in the cecum, removed with a cold snare. Resected                            and retrieved.                           - One 10 mm polyp in the proximal  transverse colon,                            removed with a hot snare. Resected and retrieved.                           - Mild diverticulosis at the hepatic flexure, in                            the ascending colon and in the cecum.                           - The examination was otherwise normal on direct                            and retroflexion views. Recommendation:           - Patient has a contact number available  for                            emergencies. The signs and symptoms of potential                            delayed complications were discussed with the                            patient. Return to normal activities tomorrow.                            Written discharge instructions were provided to the                            patient.                           - Resume previous diet.                           - Continue present medications.                           - No aspirin, ibuprofen, naproxen, or other                            non-steroidal anti-inflammatory drugs for 2 weeks                            after polyp removal.                           - Await pathology results.                           - Repeat colonoscopy is recommended for                            surveillance. The colonoscopy date will be                            determined after pathology results from today's                            exam become available for review. Jerene Bears, MD 05/02/2016 12:20:28 PM This report has been signed electronically.

## 2016-05-02 NOTE — Progress Notes (Signed)
Called to room to assist during endoscopic procedure.  Patient ID and intended procedure confirmed with present staff. Received instructions for my participation in the procedure from the performing physician.  

## 2016-05-03 ENCOUNTER — Telehealth: Payer: Self-pay

## 2016-05-03 NOTE — Telephone Encounter (Signed)
  Follow up Call-  Call back number 05/02/2016  Post procedure Call Back phone  # [phone]  Permission to leave phone message Yes     Patient questions:  Do you have a fever, pain , or abdominal swelling? No. Pain Score  0 *  Have you tolerated food without any problems? Yes.    Have you been able to return to your normal activities? Yes.    Do you have any questions about your discharge instructions: Diet   No. Medications  No. Follow up visit  No.  Do you have questions or concerns about your Care? No.  Actions: * If pain score is 4 or above: No action needed, pain <4.

## 2016-05-09 ENCOUNTER — Encounter: Payer: Self-pay | Admitting: Internal Medicine

## 2016-12-21 ENCOUNTER — Telehealth (HOSPITAL_COMMUNITY): Payer: Self-pay | Admitting: *Deleted

## 2016-12-21 NOTE — Telephone Encounter (Signed)
left voice message regarding an appointment. 

## 2016-12-28 ENCOUNTER — Telehealth (HOSPITAL_COMMUNITY): Payer: Self-pay | Admitting: *Deleted

## 2016-12-28 NOTE — Telephone Encounter (Signed)
Left voice message regarding an appointment. 

## 2017-02-06 NOTE — Progress Notes (Deleted)
Psychiatric Initial Adult Assessment   Patient Identification: Joanne Weber MRN:  [id number] Date of Evaluation:  02/06/2017 Referral Source: *** Chief Complaint:   Visit Diagnosis: No diagnosis found.  History of Present Illness:   Joanne Weber is a 71 year old female with arthritis, depression,   Associated Signs/Symptoms: Depression Symptoms:  {DEPRESSION SYMPTOMS:20000} (Hypo) Manic Symptoms:  {BHH MANIC SYMPTOMS:22872} Anxiety Symptoms:  {BHH ANXIETY SYMPTOMS:22873} Psychotic Symptoms:  {BHH PSYCHOTIC SYMPTOMS:22874} PTSD Symptoms: {BHH PTSD SYMPTOMS:22875}  Past Psychiatric History: ***  Previous Psychotropic Medications: {YES/NO:21197}  Substance Abuse History in the last 12 months:  {yes no:314532}  Consequences of Substance Abuse: {BHH CONSEQUENCES OF SUBSTANCE ABUSE:22880}  Past Medical History:  Past Medical History:  Diagnosis Date  . Allergy   . Arthritis   . Depression   . Insomnia     Past Surgical History:  Procedure Laterality Date  . ABDOMINAL HYSTERECTOMY    . BREAST ENHANCEMENT SURGERY    . COLONOSCOPY    . POLYPECTOMY    . VEIN SURGERY Left     Family Psychiatric History: ***  Family History:  Family History  Problem Relation Age of Onset  . Liver cancer Brother     and bone cancer  . Pancreatic cancer Mother   . Prostate cancer Father   . Diabetes Father   . Heart disease Father   . Other Father     ampulla fo vater tumor  . Colon cancer Maternal Aunt   . Diabetes Brother   . Colon polyps Neg Hx   . Rectal cancer Neg Hx   . Stomach cancer Neg Hx     Social History:   Social History   Social History  . Marital status: Widowed    Spouse name: N/A  . Number of children: 2  . Years of education: N/A   Occupational History  . retired    Social History Main Topics  . Smoking status: Former Smoker    Years: 2.00    Types: Cigarettes    Quit date: 11/07/1967  . Smokeless tobacco: Never Used  . Alcohol use 0.0 oz/week   Comment: occas  . Drug use: No  . Sexual activity: Not on file   Other Topics Concern  . Not on file   Social History Narrative  . No narrative on file    Additional Social History: ***  Allergies:   Allergies  Allergen Reactions  . Latex Itching, Swelling and Rash  . Penicillins Itching, Swelling and Rash    Metabolic Disorder Labs: No results found for: HGBA1C, MPG No results found for: PROLACTIN No results found for: CHOL, TRIG, HDL, CHOLHDL, VLDL, LDLCALC   Current Medications: Current Outpatient Prescriptions  Medication Sig Dispense Refill  . ALPRAZolam (XANAX) 0.25 MG tablet Take by mouth. Reported on 05/02/2016    . clotrimazole-betamethasone (LOTRISONE) cream APP EXTERNALLY AA QID  5  . escitalopram (LEXAPRO) 10 MG tablet Take 5 mg by mouth daily.     Marland Kitchen estrogens, conjugated, (PREMARIN) 0.625 MG tablet Take 0.625 mg by mouth daily. Take daily for 21 days then do not take for 7 days.    . hydrochlorothiazide (HYDRODIURIL) 25 MG tablet Take 25 mg by mouth daily.    Marland Kitchen ketoconazole (NIZORAL) 2 % cream APP AA ONCE DAILY  1  . loratadine (ALLERGY RELIEF) 10 MG tablet Take 10 mg by mouth as needed for allergies. Reported on 05/02/2016    . pantoprazole (PROTONIX) 40 MG tablet TAKE 1 TABLET BY MOUTH 30  TO 60 MINUTES BEFORE FIRST MEAL OF THE DAY 30 tablet 0  . terconazole (TERAZOL 7) 0.4 % vaginal cream IVB FOR 7 NTS UTD PRN  4   No current facility-administered medications for this visit.     Neurologic: Headache: No Seizure: No Paresthesias:No  Musculoskeletal: Strength & Muscle Tone: within normal limits Gait & Station: normal Patient leans: N/A  Psychiatric Specialty Exam: ROS  There were no vitals taken for this visit.There is no height or weight on file to calculate BMI.  General Appearance: Fairly Groomed  Eye Contact:  Good  Speech:  Clear and Coherent  Volume:  Normal  Mood:  {BHH MOOD:22306}  Affect:  {Affect (PAA):22687}  Thought Process:   Coherent and Goal Directed  Orientation:  Full (Time, Place, and Person)  Thought Content:  Logical  Suicidal Thoughts:  {ST/HT (PAA):22692}  Homicidal Thoughts:  {ST/HT (PAA):22692}  Memory:  Immediate;   Good Recent;   Good Remote;   Good  Judgement:  {Judgement (PAA):22694}  Insight:  {Insight (PAA):22695}  Psychomotor Activity:  Normal  Concentration:  Concentration: Good and Attention Span: Good  Recall:  Good  Fund of Knowledge:Good  Language: Good  Akathisia:  No  Handed:  Right  AIMS (if indicated):  ***  Assets:  Communication Skills Desire for Improvement  ADL's:  Intact  Cognition: WNL  Sleep:  ***   Assessment  Plan  The patient demonstrates the following risk factors for suicide: Chronic risk factors for suicide include: {Chronic Risk Factors for QPYPPJK:93267124}. Acute risk factors for suicide include: {Acute Risk Factors for PYKDXIP:38250539}. Protective factors for this patient include: {Protective Factors for Suicide JQBH:41937902}. Considering these factors, the overall suicide risk at this point appears to be {Desc; low/moderate/high:110033}. Patient {ACTION; IS/IS IOX:73532992} appropriate for outpatient follow up.   Treatment Plan Summary: {CHL AMB [facility] MD [place] EQAS:[id number]}   Norman Clay, MD 4/3/201810:04 AM

## 2017-02-07 ENCOUNTER — Telehealth (HOSPITAL_COMMUNITY): Payer: Self-pay | Admitting: *Deleted

## 2017-02-07 NOTE — Telephone Encounter (Signed)
Pt called stating someone called her house about an appt. Staff informed pt that she have an appt for Friday as a new pt with [facility] . Per pt, "oh, ok well I want to cancel this appt." Per pt "I don't know why I have this appt but I don't need this appt." per pt, "I don't even know who made this appt so I just want you to cancel the appt because I'm not coming".

## 2017-02-09 ENCOUNTER — Ambulatory Visit (HOSPITAL_COMMUNITY): Payer: Medicare Other | Admitting: Psychiatry

## 2019-04-17 ENCOUNTER — Encounter: Payer: Self-pay | Admitting: *Deleted

## 2019-04-24 ENCOUNTER — Encounter: Payer: Self-pay | Admitting: Internal Medicine

## 2019-08-05 ENCOUNTER — Encounter: Payer: Self-pay | Admitting: Physician Assistant

## 2019-08-05 ENCOUNTER — Ambulatory Visit (INDEPENDENT_AMBULATORY_CARE_PROVIDER_SITE_OTHER): Payer: Medicare Other | Admitting: Physician Assistant

## 2019-08-05 VITALS — HR 78 | Temp 98.1°F | Ht 68.0 in | Wt 166.2 lb

## 2019-08-05 DIAGNOSIS — Z86010 Personal history of colonic polyps: Secondary | ICD-10-CM | POA: Diagnosis not present

## 2019-08-05 DIAGNOSIS — R110 Nausea: Secondary | ICD-10-CM | POA: Diagnosis not present

## 2019-08-05 NOTE — Progress Notes (Signed)
Chief Complaint: History of adenomatous polyps, nausea  HPI:    Joanne Weber is a 73 year old female with a past medical history as listed below, known to Dr. Hilarie Fredrickson, who was referred to me by Galen Manila, MD for her history of adenomatous polyps and a complaint of nausea.      05/02/2016 colonoscopy for personal history of adenomatous polyps on colonoscopy 3 years prior.  Findings of to 2-3 mm polyps in the ascending colon and cecum, one 10 mm polyp in the proximal transverse colon, mild diverticulosis of the [facility] flexure, in the ascending colon and in the cecum and otherwise normal exam.  Pathology showed tubular adenoma and polypoid fragments.  Repeat was recommended in 3 years.    Today, the patient tells me that she is due for a repeat colonoscopy given her history of polyps.  Also complains today of 2 months of nausea which seems to be worse on an empty stomach.  If she eats something it pretty much goes away.  This can occur multiple times throughout the day.  Does note that she was started on Mobic about 2 months ago for some knee pain, she has been taking this on a daily basis.  Has also been on turmeric for a year or 2.  Takes Pantoprazole 40 mg daily for reflux.  Denies any reflux or heartburn symptoms.    Also tells me she has looser than normal stools over the past 6 years since her husband passed away.  Tells me if she "holds it in", sometimes it will firm up a little.    Denies fever, chills, blood in her stool or weight loss.  Past Medical History:  Diagnosis Date  . Allergy   . Arthritis   . Depression   . Insomnia     Past Surgical History:  Procedure Laterality Date  . ABDOMINAL HYSTERECTOMY    . BREAST ENHANCEMENT SURGERY    . COLONOSCOPY    . POLYPECTOMY    . VEIN SURGERY Left     Current Outpatient Medications  Medication Sig Dispense Refill  . escitalopram (LEXAPRO) 10 MG tablet Take 10 mg by mouth daily.     . hydrochlorothiazide (HYDRODIURIL) 25 MG tablet  Take 25 mg by mouth daily.    . pantoprazole (PROTONIX) 40 MG tablet TAKE 1 TABLET BY MOUTH 30 TO 60 MINUTES BEFORE FIRST MEAL OF THE DAY 30 tablet 0  . meclizine (ANTIVERT) 25 MG tablet 1 tablet as needed.    . meloxicam (MOBIC) 15 MG tablet 1 tablet daily.    . Omega-3 1000 MG CAPS Take 2 capsules by mouth daily.    . traZODone (DESYREL) 50 MG tablet 25 mg as needed.    . Turmeric 500 MG CAPS Take 1 capsule by mouth daily.     No current facility-administered medications for this visit.     Allergies as of 08/05/2019 - Review Complete 08/05/2019  Allergen Reaction Noted  . Latex Itching, Swelling, and Rash 01/07/2013  . Penicillins Itching, Swelling, and Rash 01/07/2013    Family History  Problem Relation Age of Onset  . Liver cancer Brother        and bone cancer  . Pancreatic cancer Mother   . Prostate cancer Father   . Diabetes Father   . Heart disease Father   . Other Father        ampulla fo vater tumor  . Colon cancer Maternal Aunt   . Diabetes Brother   .  Colon polyps Neg Hx   . Rectal cancer Neg Hx   . Stomach cancer Neg Hx     Social History   Socioeconomic History  . Marital status: Widowed    Spouse name: Not on file  . Number of children: 2  . Years of education: Not on file  . Highest education level: Not on file  Occupational History  . Occupation: retired  Scientific laboratory technician  . Financial resource strain: Not on file  . Food insecurity    Worry: Not on file    Inability: Not on file  . Transportation needs    Medical: Not on file    Non-medical: Not on file  Tobacco Use  . Smoking status: Former Smoker    Years: 2.00    Types: Cigarettes    Quit date: 11/07/1967    Years since quitting: 51.7  . Smokeless tobacco: Never Used  Substance and Sexual Activity  . Alcohol use: Yes    Alcohol/week: 0.0 standard drinks    Comment: occas  . Drug use: No  . Sexual activity: Not on file  Lifestyle  . Physical activity    Days per week: Not on file     Minutes per session: Not on file  . Stress: Not on file  Relationships  . Social Herbalist on phone: Not on file    Gets together: Not on file    Attends religious service: Not on file    Active member of club or organization: Not on file    Attends meetings of clubs or organizations: Not on file    Relationship status: Not on file  . Intimate partner violence    Fear of current or ex partner: Not on file    Emotionally abused: Not on file    Physically abused: Not on file    Forced sexual activity: Not on file  Other Topics Concern  . Not on file  Social History Narrative  . Not on file    Review of Systems:    Constitutional: No weight loss, fever, chills, weakness or fatigue Skin: No rash  Cardiovascular: No chest pain Respiratory: No SOB  Gastrointestinal: See HPI and otherwise negative Genitourinary: No dysuria Neurological: No headache, dizziness or syncope Musculoskeletal: No new muscle or joint pain Hematologic: No bleeding  Psychiatric: No history of depression or anxiety   Physical Exam:  Vital signs: BP 132/74   Pulse 78   Temp 98.1 F (36.7 C)   Ht 5\' 8"  (1.727 m)   Wt 166 lb 4 oz (75.4 kg)   BMI 25.28 kg/m   Constitutional:   Pleasant Caucasian female appears to be in NAD, Well developed, Well nourished, alert and cooperative Head:  Normocephalic and atraumatic. Eyes:   PEERL, EOMI. No icterus. Conjunctiva pink. Ears:  Normal auditory acuity. Neck:  Supple Throat: Oral cavity and pharynx without inflammation, swelling or lesion.  Respiratory: Respirations even and unlabored. Lungs clear to auscultation bilaterally.   No wheezes, crackles, or rhonchi.  Cardiovascular: Normal S1, S2. No MRG. Regular rate and rhythm. No peripheral edema, cyanosis or pallor.  Gastrointestinal:  Soft, nondistended, nontender. No rebound or guarding. Normal bowel sounds. No appreciable masses or hepatomegaly. Rectal:  Not performed.  Msk:  Symmetrical without  gross deformities. Without edema, no deformity or joint abnormality.  Neurologic:  Alert and  oriented x4;  grossly normal neurologically.  Skin:   Dry and intact without significant lesions or rashes. Psychiatric: Demonstrates good judgement  and reason without abnormal affect or behaviors.  No recent labs or imaging.  Assessment: 1.  History of adenomatous polyps: Last colonoscopy 3 years ago, repeat due now 2.  Nausea: On an empty stomach, history of GERD controlled Pantoprazole 40 mg daily; consider gastritis as a result of Turmeric and/or Mobic versus GERD 3.  Loose stool: For the past 6 years since the patient's husband passed away, this is not really worrying her  Plan: 1.  Scheduled patient for surveillance colonoscopy in the [place] with Dr. Hilarie Fredrickson.  Did discuss risks, benefits, limitations and alternatives and the patient agrees to proceed. 2.  We will hold off on EGD for now.  Patient will try discontinuing her Mobic and Turmeric to see if this helps with her nausea. 3.  Continue Pantoprazole 40 mg daily.  Add Pepcid 20 mg OTC nightly for the next month. 4.  Discussed starting a fiber supplement with the patient such as Citrucel or Benefiber to help with loose stools. 5.  Gave patient a sample of Suprep as she has a hard time with larger quantities of prep in the past. 6.  Patient will call us if the nausea continues to be a problem, could consider EGD. 7.  Patient to follow in clinic per recommendations from Dr. Hilarie Fredrickson after time of procedure.  Ellouise Newer, PA-C [facility] 08/05/2019, 1:44 PM  Cc: Galen Manila, MD

## 2019-08-05 NOTE — Patient Instructions (Addendum)
Please call to schedule your colonoscopy  Start Pepcid over the counter at bedtime for one month  Start Citrucel or Benefiber fiber supplement every day  Hold Turmeric and Mobic to see if this helps nausea

## 2019-08-07 ENCOUNTER — Ambulatory Visit: Payer: Medicare Other | Admitting: Physician Assistant

## 2019-08-07 NOTE — Progress Notes (Signed)
Addendum: Reviewed and agree with assessment and management plan. Javia Dillow M, MD  

## 2019-09-03 ENCOUNTER — Encounter: Payer: Self-pay | Admitting: Internal Medicine

## 2019-09-26 ENCOUNTER — Ambulatory Visit (AMBULATORY_SURGERY_CENTER): Payer: Self-pay

## 2019-09-26 ENCOUNTER — Other Ambulatory Visit: Payer: Self-pay

## 2019-09-26 VITALS — Temp 97.1°F | Ht 68.0 in | Wt 164.0 lb

## 2019-09-26 DIAGNOSIS — Z86010 Personal history of colonic polyps: Secondary | ICD-10-CM

## 2019-09-26 MED ORDER — NA SULFATE-K SULFATE-MG SULF 17.5-3.13-1.6 GM/177ML PO SOLN
1.0000 | Freq: Once | ORAL | 0 refills | Status: AC
Start: 2019-09-26 — End: 2019-09-26

## 2019-09-26 NOTE — Progress Notes (Signed)
Denies allergies to eggs or soy products. Denies complication of anesthesia or sedation. Denies use of weight loss medication. Denies use of O2.   Emmi instructions given for colonoscopy.  Covid screening is scheduled for Thursday 10/16/19 @ 1:10 Pm.

## 2019-10-16 ENCOUNTER — Other Ambulatory Visit: Payer: Self-pay | Admitting: Internal Medicine

## 2019-10-16 ENCOUNTER — Ambulatory Visit (INDEPENDENT_AMBULATORY_CARE_PROVIDER_SITE_OTHER): Payer: Medicare Other

## 2019-10-16 DIAGNOSIS — Z1159 Encounter for screening for other viral diseases: Secondary | ICD-10-CM

## 2019-10-17 LAB — SARS CORONAVIRUS 2 (TAT 6-24 HRS): SARS Coronavirus 2: NEGATIVE

## 2019-10-20 ENCOUNTER — Encounter: Payer: Self-pay | Admitting: Internal Medicine

## 2019-10-20 ENCOUNTER — Ambulatory Visit (AMBULATORY_SURGERY_CENTER): Payer: Medicare Other | Admitting: Internal Medicine

## 2019-10-20 ENCOUNTER — Other Ambulatory Visit: Payer: Self-pay

## 2019-10-20 VITALS — BP 114/60 | HR 64 | Temp 97.8°F | Resp 12 | Ht 68.0 in | Wt 164.0 lb

## 2019-10-20 DIAGNOSIS — Z86010 Personal history of colonic polyps: Secondary | ICD-10-CM | POA: Diagnosis not present

## 2019-10-20 MED ORDER — SODIUM CHLORIDE 0.9 % IV SOLN: 500.0000 mL | Freq: Once | INTRAVENOUS | Status: DC

## 2019-10-20 NOTE — Progress Notes (Signed)
Pt's states no medical or surgical changes since previsit or office visit.   Temp & covid screen by [facility]  Vs by [facility]

## 2019-10-20 NOTE — Op Note (Signed)
[place] Patient Name: Joanne Weber Procedure Date: 10/20/2019 8:35 AM MRN: [id number] Endoscopist: Jerene Bears , MD Age: 73 Referring MD:  Date of Birth: 05-01-1946 Gender: Female Account #: [id number] Procedure:                Colonoscopy Indications:              High risk colon cancer surveillance: Personal                            history of adenoma (10 mm or greater in size), Last                            colonoscopy 3 years ago Medicines:                Monitored Anesthesia Care Procedure:                Pre-Anesthesia Assessment:                           - Prior to the procedure, a History and Physical                            was performed, and patient medications and                            allergies were reviewed. The patient's tolerance of                            previous anesthesia was also reviewed. The risks                            and benefits of the procedure and the sedation                            options and risks were discussed with the patient.                            All questions were answered, and informed consent                            was obtained. Prior Anticoagulants: The patient has                            taken no previous anticoagulant or antiplatelet                            agents. ASA Grade Assessment: II - A patient with                            mild systemic disease. After reviewing the risks                            and benefits, the patient was deemed in  satisfactory condition to undergo the procedure.                           After obtaining informed consent, the colonoscope                            was passed under direct vision. Throughout the                            procedure, the patient's blood pressure, pulse, and                            oxygen saturations were monitored continuously. The                            Colonoscope was introduced through the  anus and                            advanced to the cecum, identified by appendiceal                            orifice and ileocecal valve. The colonoscopy was                            performed without difficulty. The patient tolerated                            the procedure well. The quality of the bowel                            preparation was good. The ileocecal valve,                            appendiceal orifice, and rectum were photographed. Scope In: 8:50:08 AM Scope Out: 9:03:19 AM Scope Withdrawal Time: 0 hours 10 minutes 7 seconds  Total Procedure Duration: 0 hours 13 minutes 11 seconds  Findings:                 The digital rectal exam was normal.                           Multiple small-mouthed diverticula were found in                            the sigmoid colon, hepatic flexure, ascending colon                            and cecum.                           The exam was otherwise without abnormality on                            direct and retroflexion views. Complications:            No immediate  complications. Estimated Blood Loss:     Estimated blood loss: none. Impression:               - Diverticulosis in the sigmoid colon, at the                            hepatic flexure, in the ascending colon and in the                            cecum.                           - The examination was otherwise normal on direct                            and retroflexion views.                           - No specimens collected. Recommendation:           - Patient has a contact number available for                            emergencies. The signs and symptoms of potential                            delayed complications were discussed with the                            patient. Return to normal activities tomorrow.                            Written discharge instructions were provided to the                            patient.                           - Resume  previous diet.                           - Continue present medications.                           - Add Benefiber 1 heaping tablespoon daily to help                            achieve complete bowel movements.                           - If breakthrough heartburn or GERD symptoms add                            famotidine 20 mg up to twice daily as needed.                           - Office follow-up  in 2-3 months to ensure reflux                            symptoms and intermittent nausea is fully                            controlled.                           - Repeat colonoscopy in 5 years for surveillance. Jerene Bears, MD 10/20/2019 9:08:00 AM This report has been signed electronically.

## 2019-10-20 NOTE — Progress Notes (Signed)
Report to PACU, RN, vss, BBS= Clear.  

## 2019-10-20 NOTE — Patient Instructions (Signed)
Please read handouts provided. Continue present medications. Add Benefiber 1 heaping tablespoon daily to help achieve complete bowel movements. If breakthrough heartburn or GERD symptoms add famotidine 20 mg up to twice daily as needed. Office follow-up in 2-3 months.       YOU HAD AN ENDOSCOPIC PROCEDURE TODAY AT [facility]:   Refer to the procedure report that was given to you for any specific questions about what was found during the examination.  If the procedure report does not answer your questions, please call your gastroenterologist to clarify.  If you requested that your care partner not be given the details of your procedure findings, then the procedure report has been included in a sealed envelope for you to review at your convenience later.  YOU SHOULD EXPECT: Some feelings of bloating in the abdomen. Passage of more gas than usual.  Walking can help get rid of the air that was put into your GI tract during the procedure and reduce the bloating. If you had a lower endoscopy (such as a colonoscopy or flexible sigmoidoscopy) you may notice spotting of blood in your stool or on the toilet paper. If you underwent a bowel prep for your procedure, you may not have a normal bowel movement for a few days.  Please Note:  You might notice some irritation and congestion in your nose or some drainage.  This is from the oxygen used during your procedure.  There is no need for concern and it should clear up in a day or so.  SYMPTOMS TO REPORT IMMEDIATELY:   Following lower endoscopy (colonoscopy or flexible sigmoidoscopy):  Excessive amounts of blood in the stool  Significant tenderness or worsening of abdominal pains  Swelling of the abdomen that is new, acute  Fever of 100F or higher   For urgent or emergent issues, a gastroenterologist can be reached at any hour by calling [phone].   DIET:  We do recommend a small meal at first, but then you may proceed to  your regular diet.  Drink plenty of fluids but you should avoid alcoholic beverages for 24 hours.  ACTIVITY:  You should plan to take it easy for the rest of today and you should NOT DRIVE or use heavy machinery until tomorrow (because of the sedation medicines used during the test).    FOLLOW UP: Our staff will call the number listed on your records 48-72 hours following your procedure to check on you and address any questions or concerns that you may have regarding the information given to you following your procedure. If we do not reach you, we will leave a message.  We will attempt to reach you two times.  During this call, we will ask if you have developed any symptoms of COVID 19. If you develop any symptoms (ie: fever, flu-like symptoms, shortness of breath, cough etc.) before then, please call [phone].  If you test positive for Covid 19 in the 2 weeks post procedure, please call and report this information to [place].    If any biopsies were taken you will be contacted by phone or by letter within the next 1-3 weeks.  Please call us at [phone] if you have not heard about the biopsies in 3 weeks.    SIGNATURES/CONFIDENTIALITY: You and/or your care partner have signed paperwork which will be entered into your electronic medical record.  These signatures attest to the fact that that the information above on your After Visit Summary has been reviewed  and is understood.  Full responsibility of the confidentiality of this discharge information lies with you and/or your care-partner. 

## 2019-10-22 ENCOUNTER — Telehealth: Payer: Self-pay | Admitting: *Deleted

## 2019-10-22 NOTE — Telephone Encounter (Signed)
  Follow up Call-  Call back number 10/20/2019  Post procedure Call Back phone  # [phone]  Permission to leave phone message Yes  Some recent data might be hidden     Patient questions:  Do you have a fever, pain , or abdominal swelling? No. Pain Score  0 *  Have you tolerated food without any problems? Yes.    Have you been able to return to your normal activities? Yes.    Do you have any questions about your discharge instructions: Diet   No. Medications  No. Follow up visit  No.  Do you have questions or concerns about your Care? No.  Actions: * If pain score is 4 or above: 1. No action needed, pain <4.Have you developed a fever since your procedure? no  2.   Have you had an respiratory symptoms (SOB or cough) since your procedure? no  3.   Have you tested positive for COVID 19 since your procedure no  4.   Have you had any family members/close contacts diagnosed with the COVID 19 since your procedure?  no   If yes to any of these questions please route to Joylene John, RN and Alphonsa Gin, Therapist, sports.

## 2020-03-26 ENCOUNTER — Encounter: Payer: Self-pay | Admitting: Internal Medicine

## 2020-05-05 ENCOUNTER — Telehealth: Payer: Self-pay | Admitting: Internal Medicine

## 2020-05-05 NOTE — Telephone Encounter (Signed)
I do not know of a specific GI provider in that area However, I did research and it appears this practice would be a nice choice  [facility] [address], [place], [place] [address]  Main [phone]

## 2020-05-05 NOTE — Telephone Encounter (Signed)
See note below and advise. 

## 2020-05-06 NOTE — Telephone Encounter (Signed)
Lm on vm for patient to return call 

## 2020-05-07 NOTE — Telephone Encounter (Signed)
Left message for pt regarding Practice name and the office phone number. Pt instructed to call back if we needed to make an actual referral.
